# Patient Record
Sex: Female | Born: 1990 | Race: White | Hispanic: No | Marital: Single | Smoking: Never smoker
Health system: Southern US, Community
[De-identification: ages and names within clinical notes are randomized; demographics above are authoritative.]

## PROBLEM LIST (undated history)

## (undated) DIAGNOSIS — F319 Bipolar disorder, unspecified: Secondary | ICD-10-CM

---

## 2017-05-09 ENCOUNTER — Encounter (HOSPITAL_COMMUNITY): Payer: Self-pay | Admitting: Emergency Medicine

## 2017-05-09 ENCOUNTER — Emergency Department (HOSPITAL_COMMUNITY): Payer: BLUE CROSS/BLUE SHIELD

## 2017-05-09 ENCOUNTER — Emergency Department (HOSPITAL_COMMUNITY)
Admission: EM | Admit: 2017-05-09 | Discharge: 2017-05-09 | Disposition: A | Discharge status: Home / Self Care | Payer: BLUE CROSS/BLUE SHIELD | Attending: Emergency Medicine | Admitting: Emergency Medicine

## 2017-05-09 DIAGNOSIS — Y929 Unspecified place or not applicable: Secondary | ICD-10-CM | POA: Diagnosis not present

## 2017-05-09 DIAGNOSIS — Y999 Unspecified external cause status: Secondary | ICD-10-CM | POA: Insufficient documentation

## 2017-05-09 DIAGNOSIS — S41131A Puncture wound without foreign body of right upper arm, initial encounter: Secondary | ICD-10-CM | POA: Insufficient documentation

## 2017-05-09 DIAGNOSIS — Z23 Encounter for immunization: Secondary | ICD-10-CM | POA: Insufficient documentation

## 2017-05-09 DIAGNOSIS — Z885 Allergy status to narcotic agent status: Secondary | ICD-10-CM | POA: Diagnosis not present

## 2017-05-09 DIAGNOSIS — Y939 Activity, unspecified: Secondary | ICD-10-CM | POA: Insufficient documentation

## 2017-05-09 DIAGNOSIS — W5501XA Bitten by cat, initial encounter: Secondary | ICD-10-CM | POA: Insufficient documentation

## 2017-05-09 HISTORY — DX: Bipolar disorder, unspecified: F31.9

## 2017-05-09 MED ORDER — NAPROXEN 500 MG PO TABS
500.0000 mg | ORAL_TABLET | Freq: Two times a day (BID) | ORAL | 0 refills | Status: AC | PRN
Start: 2017-05-09 — End: ?

## 2017-05-09 MED ORDER — LIDOCAINE-EPINEPHRINE-TETRACAINE (LET) SOLUTION
3.0000 mL | Freq: Once | NASAL | Status: AC
  Administered 2017-05-09: 3 mL via TOPICAL
  Filled 2017-05-09: qty 3

## 2017-05-09 MED ORDER — LIDOCAINE-EPINEPHRINE (PF) 2 %-1:200000 IJ SOLN
20.0000 mL | Freq: Once | INTRAMUSCULAR | Status: AC
  Administered 2017-05-09: 20 mL
  Filled 2017-05-09: qty 20

## 2017-05-09 MED ORDER — TETANUS-DIPHTH-ACELL PERTUSSIS 5-2.5-18.5 LF-MCG/0.5 IM SUSP
0.5000 mL | Freq: Once | INTRAMUSCULAR | Status: AC
  Administered 2017-05-09: 0.5 mL via INTRAMUSCULAR
  Filled 2017-05-09: qty 0.5

## 2017-05-09 MED ORDER — AMOXICILLIN-POT CLAVULANATE 875-125 MG PO TABS: 1.0000 | ORAL_TABLET | Freq: Two times a day (BID) | ORAL | 0 refills | Status: AC

## 2017-05-09 MED ORDER — HYDROCODONE-ACETAMINOPHEN 5-325 MG PO TABS: 1.0000 | ORAL_TABLET | Freq: Four times a day (QID) | ORAL | 0 refills | Status: AC | PRN

## 2017-05-09 NOTE — ED Triage Notes (Signed)
Pt reports being attacked by her cat approx 2100. Pt went to sleep and woke up to swelling and redness to R wrist. Pt appears to be either intoxicated or under the influence of drugs... Pt states cats shots are up to date and is indoor only.

## 2017-05-09 NOTE — ED Provider Notes (Signed)
[facility]-EMERGENCY DEPT Provider Note   CSN: [id number] Arrival date & time: 05/09/17  0327     History   Chief Complaint Chief Complaint  Patient presents with  . Animal Bite    HPI Theresa Freeman is a 26 y.o. female with a PMHx of bipolar 1 disorder, who presents to the ED with complaints of cat bite to right wrist/forearm that occurred around 9 PM last night. Patient states that her cat is an indoor cat, but he got out and got into a fight with another cat, when she attempted to try to pull the cat away her cat bit her right wrist. She cleansed the wound and wrapped it, went to bed, and around 2:30 AM she awoke with worsening pain and swelling so she came for further evaluation. She describes the pain as 6/10 intermittent aching right wrist pain that radiates to the right hand, worse with finger movement, and with no treatments tried prior to arrival. She reports associated puncture wounds, swelling, bruising, erythema around the puncture wounds, and mild warmth to the area. She states that her cat is up-to-date on his shots and is in her possession. Patient does not know when her last tetanus shot was. She just recently moved back here and does not have a PCP yet. No known antibiotic allergies, allergic to codeine (causes vomiting) but able to take other narcotic pain medications. She denies any ongoing bleeding, red streaking, drainage, fevers, chills, CP, SOB, abd pain, N/V/D/C, hematuria, dysuria, myalgias, arthralgias, numbness, tingling, focal weakness, or any other complaints at this time.    The history is provided by the patient and medical records. No language interpreter was used.  Animal Bite  Contact animal:  Cat Location:  Shoulder/arm Shoulder/arm injury location:  R wrist and R forearm Time since incident:  8 hours Incident location:  Home Provoked: provoked   Notifications:  None Animal's rabies vaccination status:  Up to date Animal in possession: yes   Tetanus status:   Unknown Relieved by:  None tried Worsened by:  Activity Ineffective treatments:  None tried Associated symptoms: swelling   Associated symptoms: no fever and no numbness     Past Medical History:  Diagnosis Date  . Bipolar 1 disorder (HCC)     There are no active problems to display for this patient.   History reviewed. No pertinent surgical history.  OB History    No data available       Home Medications    Prior to Admission medications   Not on File    Family History No family history on file.  Social History Social History  Substance Use Topics  . Smoking status: Never Smoker  . Smokeless tobacco: Never Used  . Alcohol use No     Comment: socially     Allergies   Codeine   Review of Systems Review of Systems  Constitutional: Negative for chills and fever.  Respiratory: Negative for shortness of breath.   Cardiovascular: Negative for chest pain.  Gastrointestinal: Negative for abdominal pain, constipation, diarrhea, nausea and vomiting.  Genitourinary: Negative for dysuria and hematuria.  Musculoskeletal: Positive for arthralgias and joint swelling.  Skin: Positive for color change and wound.  Allergic/Immunologic: Negative for immunocompromised state.  Neurological: Negative for weakness and numbness.  Psychiatric/Behavioral: Negative for confusion.   All other systems reviewed and are negative for acute change except as noted in the HPI.    Physical Exam Updated Vital Signs BP 103/89 (BP Location: Left Arm)  Pulse (!) 56   Temp 98.1 F (36.7 C) (Oral)   Resp 18   Ht 5\' 7"  (1.702 m)   Wt 70.3 kg (155 lb)   LMP 05/02/2017 (Approximate)   SpO2 100%   BMI 24.28 kg/m   Physical Exam  Constitutional: She is oriented to person, place, and time. Vital signs are normal. She appears well-developed and well-nourished.  Non-toxic appearance. No distress.  Afebrile, nontoxic, NAD  HENT:  Head: Normocephalic and atraumatic.  Mouth/Throat:  Oropharynx is clear and moist and mucous membranes are normal.  Eyes: Conjunctivae and EOM are normal. Right eye exhibits no discharge. Left eye exhibits no discharge.  Neck: Normal range of motion. Neck supple.  Cardiovascular: Normal rate, regular rhythm, normal heart sounds and intact distal pulses.  Exam reveals no gallop and no friction rub.   No murmur heard. Pulmonary/Chest: Effort normal and breath sounds normal. No respiratory distress. She has no decreased breath sounds. She has no wheezes. She has no rhonchi. She has no rales.  Abdominal: Soft. Normal appearance and bowel sounds are normal. She exhibits no distension. There is no tenderness. There is no rigidity, no rebound, no guarding, no CVA tenderness, no tenderness at McBurney's point and negative Murphy's sign.  Musculoskeletal:       Right wrist: She exhibits decreased range of motion (due to pain), tenderness, bony tenderness, swelling and laceration. She exhibits no crepitus and no deformity.  R wrist with mildly limited ROM due to pain, mild swelling and bruising over dorsal aspect of the distal forearm/wrist, mildly TTP in this area, with several small superficial puncture wounds to this area but 2 deeper puncture wounds along the ulnar aspect; wounds with no ongoing bleeding, mildly erythematous just around the wound but no red streaking or spreading erythema, no warmth, no drainage. No crepitus or deformity, no subQ air. Wiggles fingers without difficulty. Strength and sensation grossly intact, distal pulses intact, compartments soft.  SEE PICTURES BELOW  Neurological: She is alert and oriented to person, place, and time. She has normal strength. No sensory deficit.  Skin: Skin is warm and dry. Bruising and laceration noted. No rash noted. There is erythema.  R wrist puncture wounds/erythema/bruising/swelling as mentioned above and pictured below  Psychiatric: She has a normal mood and affect. Her behavior is normal.  Nursing  note and vitals reviewed.        ED Treatments / Results  Labs (all labs ordered are listed, but only abnormal results are displayed) Labs Reviewed - No data to display  EKG  EKG Interpretation None       Radiology Dg Wrist Complete Right  Result Date: 05/09/2017 CLINICAL DATA:  Cat bite. EXAM: RIGHT WRIST - COMPLETE 3+ VIEW COMPARISON:  None. FINDINGS: There is no evidence of fracture or dislocation. There is no evidence of arthropathy or other focal bone abnormality. Soft tissues are unremarkable. No radiopaque foreign body. No soft tissue gas. IMPRESSION: Negative. Electronically Signed   By: Ellery Plunkaniel R Mitchell M.D.   On: 05/09/2017 05:58    Procedures .Marland Kitchen.Incision and Drainage Date/Time: 05/09/2017 6:53 AM Performed by: Rhona RaiderSTREET, Dashana Guizar Authorized by: Rhona RaiderSTREET, Babs Dabbs   Consent:    Consent obtained:  Verbal   Consent given by:  Patient   Risks discussed:  Bleeding, incomplete drainage and pain   Alternatives discussed:  Alternative treatment and referral Location:    Indications for incision and drainage: puncture wound.   Location:  Upper extremity   Upper extremity location:  Arm   Arm  location:  R lower arm Anesthesia (see MAR for exact dosages):    Anesthesia method:  Topical application and local infiltration   Topical anesthetic:  LET   Local anesthetic:  Lidocaine 2% WITH epi Procedure type:    Complexity:  Simple Procedure details:    Needle aspiration: no     Incision types:  Single straight   Incision depth:  Subcutaneous   Scalpel blade:  11   Wound management:  Probed and deloculated, irrigated with saline and extensive cleaning   Drainage:  Bloody   Drainage amount:  Scant   Wound treatment:  Wound left open   Packing materials:  None Post-procedure details:    Patient tolerance of procedure:  Tolerated well, no immediate complications   (including critical care time)  Medications Ordered in ED Medications  lidocaine-EPINEPHrine (XYLOCAINE  W/EPI) 2 %-1:200000 (PF) injection 20 mL (20 mLs Infiltration Given by Other 05/09/17 0604)  Tdap (BOOSTRIX) injection 0.5 mL (0.5 mLs Intramuscular Given 05/09/17 0603)  lidocaine-EPINEPHrine-tetracaine (LET) solution (3 mLs Topical Given 05/09/17 0603)     Initial Impression / Assessment and Plan / ED Course  I have reviewed the triage vital signs and the nursing notes.  Pertinent labs & imaging results that were available during my care of the patient were reviewed by me and considered in my medical decision making (see chart for details).     26 y.o. female here with cat bite to R wrist that occurred around 9pm, and when she woke up around 2:30am the area had become more red, swollen, and painful so she came in for further evaluation. On exam, multiple small superficial puncture wounds around dorsal aspect of forearm, with the worst being 2 deeper punctures on the ulnar aspect; wounds with mild erythema just around the wounds, but no red streaking, no drainage, no ongoing bleeding, no warmth. +Swelling and slight bruising. NVI with soft compartments, diffuse tenderness over the wounds and wrist. Will update Tdap, get xray, and proceed with opening the deeper 2 punctures so we can properly irrigate it. Pt declines wanting pain meds, but wants to minimize feeling the needle for the lidocaine so will apply LET ahead of time. Will reassess shortly  7:04 AM Xray neg for underlying bony injury or retained FB. Puncture wounds opened with 11 blade and irrigated copiously from within the puncture wounds, using total of 1L of saline to irrigate areas. Wounds dressed and ACE wrap applied. Will start on abx, advised proper wound care, f/up with [facility] in 2 days for wound recheck, and with hand specialist in 5-7 days for recheck; discussed RICE to help with pain/swelling. Pain meds given. Strict return precautions advised. [place] prior to dispensing controlled substance medications, and 1 year  search was notable for: no narcotic controlled substances found. Risks/benefits/alternatives and expectations discussed regarding controlled substances. Side effects of medications discussed. Informed consent obtained. I explained the diagnosis and have given explicit precautions to return to the ER including for any other new or worsening symptoms. The patient understands and accepts the medical plan as it's been dictated and I have answered their questions. Discharge instructions concerning home care and prescriptions have been given. The patient is STABLE and is discharged to home in good condition.    Final Clinical Impressions(s) / ED Diagnoses   Final diagnoses:  Cat bite, initial encounter  Puncture wound of multiple sites of right upper extremity, initial encounter    New Prescriptions New Prescriptions   AMOXICILLIN-CLAVULANATE (AUGMENTIN) 875-125 MG  TABLET    Take 1 tablet by mouth 2 (two) times daily. One po bid x 7 days   HYDROCODONE-ACETAMINOPHEN (NORCO) 5-325 MG TABLET    Take 1 tablet by mouth every 6 (six) hours as needed for severe pain.   NAPROXEN (NAPROSYN) 500 MG TABLET    Take 1 tablet (500 mg total) by mouth 2 (two) times daily as needed for mild pain, moderate pain or headache (TAKE WITH MEALS.).     [address] [place], [place] 05/09/17 1610    Dione Booze, MD 05/09/17 [phone]

## 2017-05-09 NOTE — Discharge Instructions (Signed)
Keep wounds clean and dry. Apply warm compresses to affected area throughout the day; use ice and elevate the area to help with pain and swelling. Take antibiotic until it is finished. Take naprosyn and norco as directed, as needed for pain but do not drive or operate machinery with pain medication use. [facility] for wound recheck; follow up with the hand specialist in 5-7 days for recheck of wounds and ongoing management of the injury. Monitor area for signs of infection to include, but not limited to: increasing pain, spreading redness, drainage/pus, worsening swelling, or fevers. Return to emergency department for emergent changing or worsening symptoms.

## 2018-06-26 IMAGING — DX DG WRIST COMPLETE 3+V*R*
4 series · 4 of 4 positions shown · non-contrast
Comparison: None.

CLINICAL DATA: Cat bite.

EXAM:
RIGHT WRIST - COMPLETE 3+ VIEW

[wrist pa]
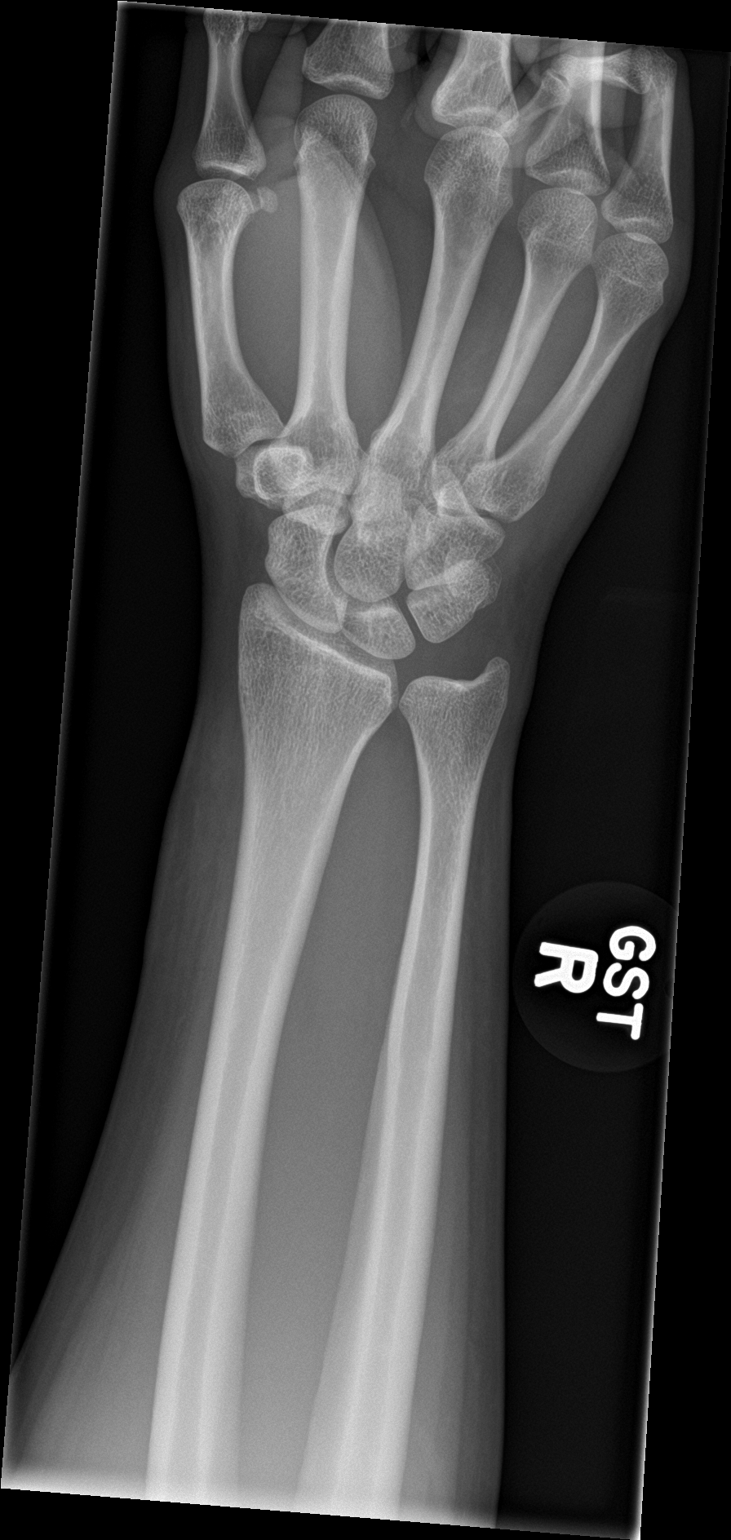

[wrist obl]
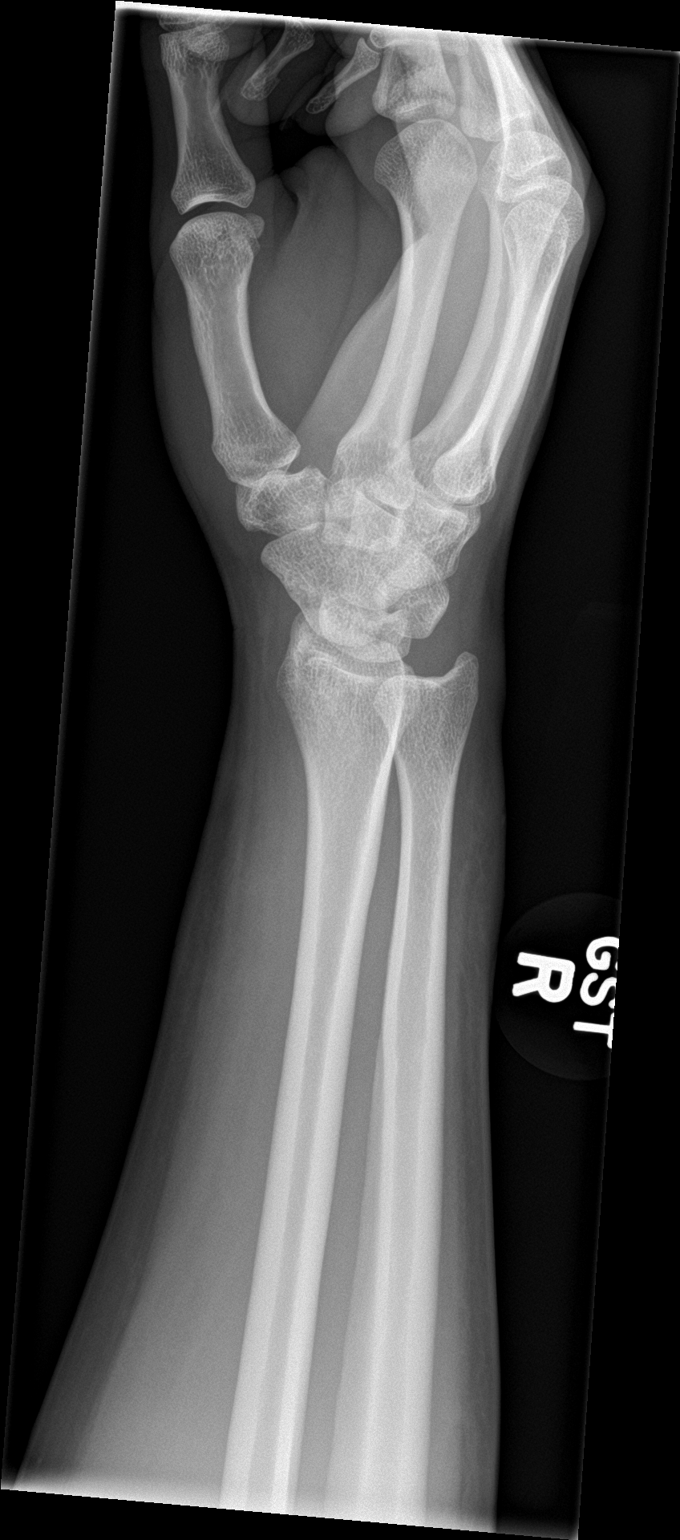

[wrist lat]
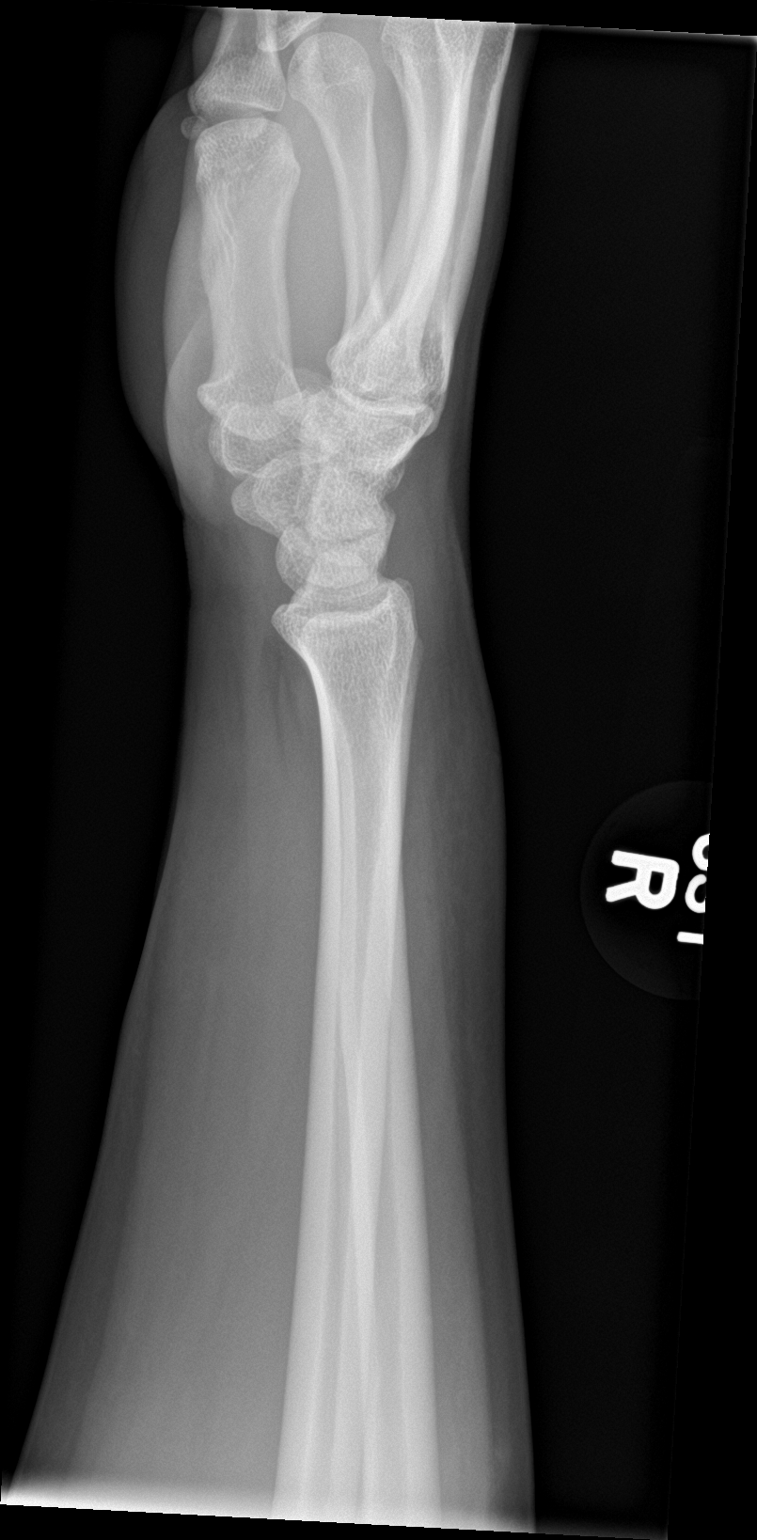

[wrist navicular]
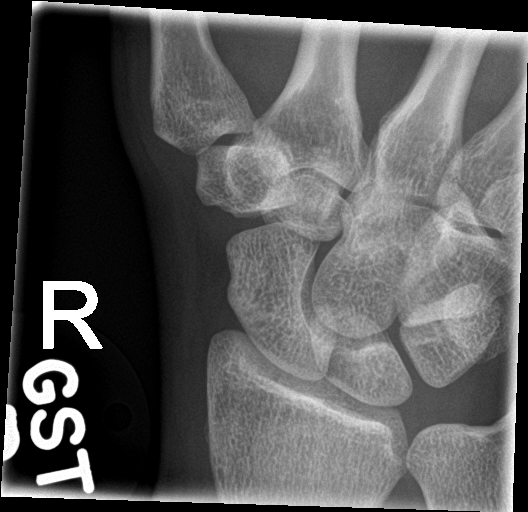

[4 of 4 positions shown; findings below may reference images not displayed]

FINDINGS: There is no evidence of fracture or dislocation. There is no
evidence of arthropathy or other focal bone abnormality. Soft
tissues are unremarkable. No radiopaque foreign body. No soft tissue
gas.
IMPRESSION: Negative.
